# Patient Record
Sex: Female | Born: 2000 | Race: Black or African American | Hispanic: No | Marital: Single | Smoking: Never smoker
Health system: Southern US, Community
[De-identification: ages and names within clinical notes are randomized; demographics above are authoritative.]

## PROBLEM LIST (undated history)

## (undated) DIAGNOSIS — H18609 Keratoconus, unspecified, unspecified eye: Secondary | ICD-10-CM

## (undated) DIAGNOSIS — J45909 Unspecified asthma, uncomplicated: Secondary | ICD-10-CM

## (undated) HISTORY — DX: Keratoconus, unspecified, unspecified eye: H18.609

## (undated) HISTORY — PX: EYE SURGERY: SHX253

---

## 2022-03-29 ENCOUNTER — Emergency Department
Admission: EM | Admit: 2022-03-29 | Discharge: 2022-03-29 | Disposition: A | Discharge status: Home / Self Care | Payer: 59 | Attending: Emergency Medicine | Admitting: Emergency Medicine

## 2022-03-29 ENCOUNTER — Other Ambulatory Visit: Payer: Self-pay

## 2022-03-29 ENCOUNTER — Emergency Department: Payer: 59

## 2022-03-29 DIAGNOSIS — Z2914 Encounter for prophylactic rabies immune globin: Secondary | ICD-10-CM | POA: Diagnosis not present

## 2022-03-29 DIAGNOSIS — Z203 Contact with and (suspected) exposure to rabies: Secondary | ICD-10-CM | POA: Insufficient documentation

## 2022-03-29 DIAGNOSIS — Z23 Encounter for immunization: Secondary | ICD-10-CM | POA: Diagnosis not present

## 2022-03-29 DIAGNOSIS — W5321XA Bitten by squirrel, initial encounter: Secondary | ICD-10-CM | POA: Diagnosis not present

## 2022-03-29 DIAGNOSIS — Y92214 College as the place of occurrence of the external cause: Secondary | ICD-10-CM | POA: Diagnosis not present

## 2022-03-29 DIAGNOSIS — S61231A Puncture wound without foreign body of left index finger without damage to nail, initial encounter: Secondary | ICD-10-CM | POA: Diagnosis not present

## 2022-03-29 DIAGNOSIS — S6992XA Unspecified injury of left wrist, hand and finger(s), initial encounter: Secondary | ICD-10-CM | POA: Diagnosis present

## 2022-03-29 HISTORY — DX: Unspecified asthma, uncomplicated: J45.909

## 2022-03-29 MED ORDER — RABIES IMMUNE GLOBULIN 150 UNIT/ML IM INJ
20.0000 [IU]/kg | INJECTION | Freq: Once | INTRAMUSCULAR | Status: AC
  Administered 2022-03-29: 1650 [IU] via INTRAMUSCULAR
  Filled 2022-03-29: qty 11

## 2022-03-29 MED ORDER — DOXYCYCLINE MONOHYDRATE 100 MG PO TABS: 100.0000 mg | ORAL_TABLET | Freq: Two times a day (BID) | ORAL | 0 refills | Status: AC

## 2022-03-29 MED ORDER — RABIES VACCINE, PCEC IM SUSR
1.0000 mL | Freq: Once | INTRAMUSCULAR | Status: AC
  Administered 2022-03-29: 1 mL via INTRAMUSCULAR
  Filled 2022-03-29: qty 1

## 2022-03-29 NOTE — Discharge Instructions (Addendum)
Take doxycycline twice daily for the next 14 days. ?Follow up May 4th, 8th and 15th and either [place] urgent care or [place] urgent care to the [facility] system for subsequent rabies vaccines. ?

## 2022-03-29 NOTE — ED Provider Notes (Signed)
? ?[facility] ?Provider Note ? ?Patient Contact: 7:23 PM (approximate) ? ? ?History  ? ?Animal Bite ? ? ?HPI ? ?Dawn Simpson is a 21 y.o. female presents to the emergency department after patient was bitten by a squirrel along the left index finger.  Patient is an Landscape architect.  No numbness or tingling in the left hand.  No similar injuries in the past.  Animal is not available for quarantine. ? ?  ? ? ?Physical Exam  ? ?Triage Vital Signs: ?ED Triage Vitals  ?Enc Vitals Group  ?   BP 03/29/22 1802 126/79  ?   Pulse Rate 03/29/22 1802 65  ?   Resp 03/29/22 1802 16  ?   Temp 03/29/22 1802 98.4 ?F (36.9 ?C)  ?   Temp Source 03/29/22 1802 Oral  ?   SpO2 03/29/22 1802 99 %  ?   Weight 03/29/22 1803 185 lb (83.9 kg)  ?   Height 03/29/22 1803 5\' 11"  (1.803 m)  ?   Head Circumference --   ?   Peak Flow --   ?   Pain Score 03/29/22 1803 2  ?   Pain Loc --   ?   Pain Edu? --   ?   Excl. in GC? --   ? ? ?Most recent vital signs: ?Vitals:  ? 03/29/22 1802  ?BP: 126/79  ?Pulse: 65  ?Resp: 16  ?Temp: 98.4 ?F (36.9 ?C)  ?SpO2: 99%  ? ? ? ?General: Alert and in no acute distress. ?Eyes:  PERRL. EOMI. ?Head: No acute traumatic findings ?ENT: ?     Ears:  ?     Nose: No congestion/rhinnorhea. ?     Mouth/Throat: Mucous membranes are moist. ?Neck: No stridor. No cervical spine tenderness to palpation. ?Cardiovascular:  Good peripheral perfusion ?Respiratory: Normal respiratory effort without tachypnea or retractions. Lungs CTAB. Good air entry to the bases with no decreased or absent breath sounds. ?Gastrointestinal: Bowel sounds ?4 quadrants. Soft and nontender to palpation. No guarding or rigidity. No palpable masses. No distention. No CVA tenderness. ?Musculoskeletal: Full range of motion to all extremities.  Patient has small puncture wound along the volar aspect of the left index finger. ?Neurologic:  No gross focal neurologic deficits are appreciated.  ?Other: ? ? ?ED Results / Procedures / Treatments   ? ?Labs ?(all labs ordered are listed, but only abnormal results are displayed) ?Labs Reviewed - No data to display ? ? ? ? ? ?RADIOLOGY ? ?I personally viewed and evaluated these images as part of my medical decision making, as well as reviewing the written report by the radiologist. ? ?ED Provider Interpretation:  ? ? ?PROCEDURES: ? ?Critical Care performed: No ? ?Procedures ? ? ?MEDICATIONS ORDERED IN ED: ?Medications  ?rabies vaccine (RABAVERT) injection 1 mL (1 mL Intramuscular Given 03/29/22 2008)  ?rabies immune globulin (HYPERAB/KEDRAB) injection 1,650 Units (1,650 Units Intramuscular Given 03/29/22 2007)  ? ? ? ?IMPRESSION / MDM / ASSESSMENT AND PLAN / ED COURSE  ?I reviewed the triage vital signs and the nursing notes. ?             ?               ? ?Assessment and plan ?Animal bite ?21 year old female presents to the emergency department after being bitten by a squirrel. ? ?Vital signs are reassuring at triage.  On physical exam, patient was alert, active and nontoxic-appearing.  Explained to patient that risk of contracting rabies from  a squirrel is relatively low but patient elected to start rabies vaccine series empirically. ? ?X-ray of the left index finger was obtained which showed no evidence of acute bony abnormality or retained teeth. ? ?Patient was started on doxycycline given amoxicillin allergy.  She adamantly denies possibility of pregnancy.  Her tetanus status is up-to-date.  Rabies vaccine series was initiated in the emergency department and she was advised to follow-up in 3, 7 and 14 days at [facility] or [place] urgent care for subsequent rabies vaccines. ?  ? ? ?FINAL CLINICAL IMPRESSION(S) / ED DIAGNOSES  ? ?Final diagnoses:  ?Wound due to squirrel bite  ? ? ? ?Rx / DC Orders  ? ?ED Discharge Orders   ? ?      Ordered  ?  doxycycline (ADOXA) 100 MG tablet  2 times daily       ? 03/29/22 2057  ? ?  ?  ? ?  ? ? ? ?Note:  This document was prepared using Dragon voice recognition software and  may include unintentional dictation errors. ?  ?Orvil Feil, PA-C ?03/29/22 2101 ? ?  ?Gilles Chiquito, MD ?03/29/22 2303 ? ?

## 2022-03-29 NOTE — ED Notes (Signed)
Pt signed esignature  d/c inst to pt.   

## 2022-03-29 NOTE — ED Triage Notes (Signed)
Pt states she was on campus and a squirrel was crawling on a friend and when they went to get it off it bit her on the left index finger ?

## 2022-03-29 NOTE — ED Notes (Signed)
Pt's left index finger wrapped and bleeding controlled. Pt was bit by a squirrel on campus. Pt in NAD and ambulatory to room without difficulty.  ?

## 2022-04-01 ENCOUNTER — Ambulatory Visit
Admission: EM | Admit: 2022-04-01 | Discharge: 2022-04-01 | Disposition: A | Discharge status: Home / Self Care | Payer: PRIVATE HEALTH INSURANCE | Attending: Family Medicine | Admitting: Family Medicine

## 2022-04-01 DIAGNOSIS — Z203 Contact with and (suspected) exposure to rabies: Secondary | ICD-10-CM

## 2022-04-01 MED ORDER — RABIES VACCINE, PCEC IM SUSR
1.0000 mL | Freq: Once | INTRAMUSCULAR | Status: AC
  Administered 2022-04-01: 1 mL via INTRAMUSCULAR

## 2022-04-01 NOTE — ED Triage Notes (Signed)
Pt presents for 1 of 3 rabies vaccine ?

## 2022-04-05 ENCOUNTER — Ambulatory Visit
Admission: EM | Admit: 2022-04-05 | Discharge: 2022-04-05 | Disposition: A | Discharge status: Home / Self Care | Payer: PRIVATE HEALTH INSURANCE | Attending: Family Medicine | Admitting: Family Medicine

## 2022-04-05 DIAGNOSIS — Z203 Contact with and (suspected) exposure to rabies: Secondary | ICD-10-CM

## 2022-04-05 MED ORDER — RABIES VACCINE, PCEC IM SUSR
1.0000 mL | Freq: Once | INTRAMUSCULAR | Status: AC
  Administered 2022-04-05: 1 mL via INTRAMUSCULAR

## 2022-04-05 NOTE — ED Triage Notes (Signed)
Pt presents for 2 of 3 rabies vaccine.  ?

## 2022-04-12 ENCOUNTER — Ambulatory Visit
Admission: EM | Admit: 2022-04-12 | Discharge: 2022-04-12 | Disposition: A | Discharge status: Home / Self Care | Payer: 59 | Attending: Emergency Medicine | Admitting: Emergency Medicine

## 2022-04-12 DIAGNOSIS — Z203 Contact with and (suspected) exposure to rabies: Secondary | ICD-10-CM | POA: Diagnosis not present

## 2022-04-12 MED ORDER — RABIES VACCINE, PCEC IM SUSR
1.0000 mL | Freq: Once | INTRAMUSCULAR | Status: AC
  Administered 2022-04-12: 1 mL via INTRAMUSCULAR

## 2022-04-12 NOTE — ED Triage Notes (Signed)
Patient presents for 3rd rabies injection.  ? ?Patient has no complaints.  ?

## 2022-10-02 ENCOUNTER — Ambulatory Visit (INDEPENDENT_AMBULATORY_CARE_PROVIDER_SITE_OTHER): Payer: 59 | Admitting: Physician Assistant

## 2022-10-02 ENCOUNTER — Encounter: Payer: Self-pay | Admitting: Physician Assistant

## 2022-10-02 ENCOUNTER — Other Ambulatory Visit: Payer: Self-pay

## 2022-10-02 VITALS — BP 120/73 | HR 95 | Temp 97.6°F | Ht 70.08 in | Wt 189.6 lb

## 2022-10-02 DIAGNOSIS — J209 Acute bronchitis, unspecified: Secondary | ICD-10-CM | POA: Diagnosis not present

## 2022-10-02 DIAGNOSIS — J45901 Unspecified asthma with (acute) exacerbation: Secondary | ICD-10-CM

## 2022-10-02 LAB — POC COVID19 BINAXNOW: SARS Coronavirus 2 Ag: NEGATIVE

## 2022-10-02 MED ORDER — PREDNISONE 20 MG PO TABS: ORAL_TABLET | ORAL | 0 refills | Status: AC

## 2022-10-02 MED ORDER — DOXYCYCLINE HYCLATE 100 MG PO TABS: 100.0000 mg | ORAL_TABLET | Freq: Two times a day (BID) | ORAL | 0 refills | Status: AC

## 2022-10-02 NOTE — Progress Notes (Signed)
[facility] [address] [place], [place] [address] Phone: [phone] Fax: [phone]   Office Visit Note  Patient Name: Dawn Simpson Date of Birth: [id number]  Med Record Number: [id number]  Date of Service: 10/02/2022   Chief Complaint  Patient presents with   sick     Isabeau Mccalla is a 21 y.o. female with a PMHx of asthma, who presents to [facility] with complaints of cold symptoms since 1-2 weeks ago, which started getting worse at the end of last week. Having a lot of congestion, nasal pressure, and ear popping. Also cough which seems worse. Struggling with her breathing now. +HA, +chills, +fatigue. +Wheezing.  Has tried dayquil, nyquil, tylenol, which help some. Using her albuterol inhaler, which helps some.  No known aggravating factors.  Took a COVID test 10 days ago which was negative.  +Multiple sick contacts. No known COVID/flu exposures, fully vaccinated and boosted against COVID. Hasn't had flu vaccine this season.  From [place], +hx of seasonal allergies. +Hx of mono in 6th grade. Denies fevers, ear pain/drainage, ST, drooling/trismus, loss of taste/smell, CP, abd pain, N/V/D, myalgias, arthralgias, or any other complaints at this time.      Allergies: Peanut-containing drug products and Penicillins  Current Medications:  Outpatient Encounter Medications as of 10/02/2022  Medication Sig   albuterol (VENTOLIN HFA) 108 (90 Base) MCG/ACT inhaler SMARTSIG:2 Puff(s) By Mouth Every 4 Hours PRN   doxycycline (VIBRA-TABS) 100 MG tablet Take 1 tablet (100 mg total) by mouth 2 (two) times daily.   predniSONE (DELTASONE) 20 MG tablet 3 tabs po daily x 4 days   PULMICORT FLEXHALER 90 MCG/ACT inhaler Inhale 2 puffs into the lungs 2 (two) times daily.   No facility-administered encounter medications on file as of 10/02/2022.     Medical History: Past Medical History:  Diagnosis Date   Asthma    Keratoconus    Past Surgical History:  Procedure Laterality Date   EYE  SURGERY      History reviewed. No pertinent family history.  Social History   Tobacco Use  Smoking Status Never  Smokeless Tobacco Never    Social History   Substance and Sexual Activity  Alcohol Use Not Currently      ROS Review of Systems  Constitutional:  Positive for chills and fatigue. Negative for fever.  HENT:  Positive for congestion and rhinorrhea. Negative for drooling, ear discharge, ear pain, sore throat and trouble swallowing.   Respiratory:  Positive for cough, chest tightness, shortness of breath and wheezing.   Cardiovascular:  Negative for chest pain.  Gastrointestinal:  Negative for abdominal pain, diarrhea, nausea and vomiting.  Musculoskeletal:  Negative for arthralgias and myalgias.  Skin:  Negative for color change.  Allergic/Immunologic: Negative for immunocompromised state.  Neurological:  Positive for headaches.   All other pertinent ROS reviewed and noted per HPI.   Vital Signs: BP 120/73   Pulse 95   Temp 97.6 F (36.4 C) (Tympanic)   Ht 5' 10.08" (1.78 m)   Wt 189 lb 9.5 oz (86 kg)   SpO2 96%   BMI 27.14 kg/m   Physical Exam: Physical Exam Vitals and nursing note reviewed.  Constitutional:      General: She is not in acute distress.    Appearance: Normal appearance. She is well-developed. She is not toxic-appearing.     Comments: Afebrile, nontoxic, NAD  HENT:     Head: Normocephalic and atraumatic.     Right Ear: Tympanic membrane and ear canal  normal.     Left Ear: Tympanic membrane and ear canal normal.     Nose: Congestion and rhinorrhea present.     Mouth/Throat:     Mouth: Mucous membranes are moist.     Pharynx: Oropharynx is clear. Uvula midline. No pharyngeal swelling, oropharyngeal exudate, posterior oropharyngeal erythema or uvula swelling.     Tonsils: No tonsillar exudate or tonsillar abscesses. 0 on the right. 0 on the left.  Eyes:     General:        Right eye: No discharge.        Left eye: No discharge.      Conjunctiva/sclera: Conjunctivae normal.  Cardiovascular:     Rate and Rhythm: Normal rate and regular rhythm.     Pulses: Normal pulses.     Heart sounds: Normal heart sounds, S1 normal and S2 normal. No murmur heard.    No friction rub. No gallop.  Pulmonary:     Effort: Pulmonary effort is normal. No respiratory distress.     Breath sounds: Rhonchi present. No decreased breath sounds, wheezing or rales.     Comments: Mild scattered rhonchi, otherwise CTAB in all other fields, no wheeze/rales, no hypoxia or increased WOB, speaking in full sentences, SpO2 96% on RA  Abdominal:     General: Bowel sounds are normal. There is no distension.     Palpations: Abdomen is soft. Abdomen is not rigid.     Tenderness: There is no abdominal tenderness. There is no right CVA tenderness, left CVA tenderness, guarding or rebound. Negative signs include McBurney's sign.  Musculoskeletal:        General: Normal range of motion.     Cervical back: Normal range of motion and neck supple.  Lymphadenopathy:     Cervical: Cervical adenopathy (shotty, nonTTP) present.  Skin:    General: Skin is warm and dry.     Findings: No rash.  Neurological:     Mental Status: She is alert and oriented to person, place, and time.     Sensory: Sensation is intact. No sensory deficit.     Motor: Motor function is intact.  Psychiatric:        Mood and Affect: Mood and affect normal.        Behavior: Behavior normal.       Pertinent Labs/Imaging:  Results for orders placed or performed in visit on 10/02/22  POC COVID-19  Result Value Ref Range   SARS Coronavirus 2 Ag Negative Negative   No results found.   Assessment/Plan:  1. Acute bronchitis with asthma with acute exacerbation - POC COVID-19     -Pt here with 2wks of worsening URI/cough symptoms, exam reveals pt is afebrile with a rhonchi but otherwise no wheezing, no hypoxia or increased WOB, mild nasal congestion and drainage, throat benign.  -Rapid  COVID Ag negative.  -Doubt need for other testing at this time. -Given timeframe and progression, will empirically tx for bacterial etiologies -Rx doxycycline given. Also given rx for Prednisone.  -Declined neb treatment here, has neb machine at home; advised waiting until Monday to see how she feels, if she thinks she needs the machine overnighted then have her parents send it, and we can provide rx's for the neb solutions-- she knows to message for this.  -Discussed OTCs for symptomatic relief and supportive care (i.e. Alternating Tylenol 1000mg  max and Ibuprofen 600-800mg  max, Chloraseptic spray/throat lozenges for sore throat, hot tea with honey/lemon for sore throat, Flonase 2 sprays each nostril  twice daily and Netipot for congestion, antihistamines like Zyrtec/Claritin/Xyzal/etc, decongestants like Sudafed for congestion with caution, Mucinex D for cough/congestion or Mucinex DM for cough/expectoration, adequate hydration and rest, etc).  -F/up as needed or sooner if not improving.  -Strict return/ED guidelines discussed.    Meds ordered this encounter  Medications   predniSONE (DELTASONE) 20 MG tablet    Sig: 3 tabs po daily x 4 days    Dispense:  12 tablet    Refill:  0    Order Specific Question:   Supervising Provider    Answer:   Dutch Gray [[id number]]   doxycycline (VIBRA-TABS) 100 MG tablet    Sig: Take 1 tablet (100 mg total) by mouth 2 (two) times daily.    Dispense:  14 tablet    Refill:  0    Order Specific Question:   Supervising Provider    Answer:   Dutch Gray [[id number]]      General Counseling: Jesusita Oka understanding of the findings of todays visit and agrees with plan of treatment. I have discussed any further diagnostic evaluation that may be needed or ordered today. We also reviewed her medications today. she has been encouraged to call the office with any questions or concerns that should arise related to today's visit.   Total Time spent: [place], [place]

## 2022-10-02 NOTE — Patient Instructions (Signed)
Continue to stay well-hydrated. Take antibiotic until finished. Start prednisone (steroid) today, then take with breakfast all the other days. Use home inhaler as directed, as needed for cough/chest congestion/wheezing/shortness of breath. Gargle warm salt water and spit it out and use chloraseptic spray as needed for sore throat. Drink tea with honey to help with sore throat. Continue to alternate between Tylenol and Ibuprofen for pain or fever. Use Mucinex DM/Robitussin DM/etc for cough suppression/expectoration of mucus. Use over the counter flonase  (2 sprays each nostril twice daily) and the netipot to help with nasal congestion. May consider over-the-counter Benadryl or other antihistamine like Claritin/Zyrtec/Xyzal/etc to decrease secretions and for help with your symptoms.  Follow up here in the clinic if symptoms aren't improving in 5-7 days or if your symptoms suddenly start worsening. Go to the emergency department for emergent changing or worsening of symptoms.

## 2022-10-06 ENCOUNTER — Ambulatory Visit (INDEPENDENT_AMBULATORY_CARE_PROVIDER_SITE_OTHER): Payer: 59 | Admitting: Family Medicine

## 2022-10-06 ENCOUNTER — Other Ambulatory Visit: Payer: Self-pay

## 2022-10-06 ENCOUNTER — Encounter: Payer: Self-pay | Admitting: Family Medicine

## 2022-10-06 VITALS — BP 118/76 | HR 67 | Temp 98.9°F | Resp 18 | Ht 70.0 in | Wt 191.0 lb

## 2022-10-06 DIAGNOSIS — J209 Acute bronchitis, unspecified: Secondary | ICD-10-CM

## 2022-10-06 DIAGNOSIS — J45901 Unspecified asthma with (acute) exacerbation: Secondary | ICD-10-CM | POA: Diagnosis not present

## 2022-10-06 NOTE — Progress Notes (Signed)
[facility] [address] [place], [place] [address] Phone: [phone] Fax: [phone]   Office Visit Note  Patient Name: Dawn Simpson  Date of Birth:10-14-2001  Med Rec number [id number]  Date of Service: 10/06/2022  Peanut-containing drug products and Penicillins  Chief Complaint  Patient presents with   Cough     See visit from the weekend Last week had vertigo, coughed up blood, was febrile Chest congestion has improved, taking her steroids and antibiotics Nebuliser arrived yesterday Had temp of 101 yesterday  Has not yet had her flu shot  Is not getting much help from her albuterol inhaler - helps for about 40 minutes only Is aware that when her asthma flares she almost always needs oral steroids  Currently feels exhausted    Cough Associated symptoms include a fever. Her past medical history is significant for environmental allergies.   Current Medication:  Outpatient Encounter Medications as of 10/06/2022  Medication Sig   albuterol (VENTOLIN HFA) 108 (90 Base) MCG/ACT inhaler SMARTSIG:2 Puff(s) By Mouth Every 4 Hours PRN   doxycycline (VIBRA-TABS) 100 MG tablet Take 1 tablet (100 mg total) by mouth 2 (two) times daily.   predniSONE (DELTASONE) 20 MG tablet 3 tabs po daily x 4 days   PULMICORT FLEXHALER 90 MCG/ACT inhaler Inhale 2 puffs into the lungs 2 (two) times daily.   No facility-administered encounter medications on file as of 10/06/2022.      Medical History: Past Medical History:  Diagnosis Date   Asthma    Keratoconus      Vital Signs: BP 118/76   Pulse 67   Temp 98.9 F (37.2 C) (Tympanic)   Resp 18   Ht 5\' 10"  (1.778 m)   Wt 191 lb (86.6 kg)   SpO2 99%   BMI 27.41 kg/m  Peak flow reading is 350, about 60 % of predicted.    Review of Systems  Constitutional:  Positive for fatigue and fever.  Respiratory:  Positive for cough.   Allergic/Immunologic: Positive for environmental allergies.    Physical Exam Vitals  reviewed.  Constitutional:      General: She is not in acute distress.    Appearance: Normal appearance. She is not ill-appearing.  HENT:     Right Ear: Tympanic membrane normal.     Left Ear: Tympanic membrane normal.     Nose:     Right Turbinates: Swollen and pale.     Left Turbinates: Swollen and pale.     Right Sinus: No maxillary sinus tenderness or frontal sinus tenderness.     Left Sinus: No maxillary sinus tenderness or frontal sinus tenderness.     Mouth/Throat:     Mouth: Mucous membranes are moist.     Pharynx: Oropharynx is clear. No pharyngeal swelling, oropharyngeal exudate or posterior oropharyngeal erythema.  Pulmonary:     Effort: Pulmonary effort is normal. Prolonged expiration present.     Breath sounds: Decreased air movement present. Examination of the right-lower field reveals wheezing. Wheezing present.  Neurological:     Mental Status: She is alert.     Assessment/Plan:  1. Acute bronchitis with asthma with acute exacerbation Improving compared with the weekend Advise using her albuterol nebulizer every 4 hours to open her lungs better so that when she does use the inhaler it is more effective Continue steroids and antibiotics Follow up with me Friday morning at 9am for review before the weekend      General Counseling: Dawn Simpson verbalizes understanding of the  findings of todays visit and agrees with plan of treatment. I have discussed any further diagnostic evaluation that may be needed or ordered today. We also reviewed her medications today. she has been encouraged to call the office with any questions or concerns that should arise related to todays visit.   No orders of the defined types were placed in this encounter.   No orders of the defined types were placed in this encounter.     Dr Durwin Reges Jacklyn Branan ABFM University Physician

## 2022-10-08 ENCOUNTER — Ambulatory Visit: Payer: 59 | Admitting: Family Medicine

## 2022-10-11 ENCOUNTER — Encounter: Payer: Self-pay | Admitting: Family Medicine

## 2022-10-11 ENCOUNTER — Ambulatory Visit (INDEPENDENT_AMBULATORY_CARE_PROVIDER_SITE_OTHER): Payer: 59 | Admitting: Family Medicine

## 2022-10-11 VITALS — HR 71 | Temp 97.4°F | Resp 18 | Ht 70.0 in | Wt 191.0 lb

## 2022-10-11 DIAGNOSIS — J45901 Unspecified asthma with (acute) exacerbation: Secondary | ICD-10-CM | POA: Diagnosis not present

## 2022-10-11 DIAGNOSIS — J209 Acute bronchitis, unspecified: Secondary | ICD-10-CM | POA: Diagnosis not present

## 2022-10-11 DIAGNOSIS — R0981 Nasal congestion: Secondary | ICD-10-CM

## 2022-10-11 NOTE — Progress Notes (Signed)
[facility] [address] [place], [place] [address] Phone: [phone] Fax: [phone]   Office Visit Note  Patient Name: Dawn Simpson  Date of Birth:09-Aug-2001  Med Rec number [id number]  Date of Service: 10/11/2022  Peanut-containing drug products and Penicillins  Chief Complaint  Patient presents with   Follow-up     See prior OV notes  Here for follow-up Feels much better - has cut back on her nebuliser use - used this morning only Has not been using her inhaler  Main problems now is nasal congestion and ear popping    Current Medication:  Outpatient Encounter Medications as of 10/11/2022  Medication Sig   albuterol (VENTOLIN HFA) 108 (90 Base) MCG/ACT inhaler SMARTSIG:2 Puff(s) By Mouth Every 4 Hours PRN   doxycycline (VIBRA-TABS) 100 MG tablet Take 1 tablet (100 mg total) by mouth 2 (two) times daily.   predniSONE (DELTASONE) 20 MG tablet 3 tabs po daily x 4 days   PULMICORT FLEXHALER 90 MCG/ACT inhaler Inhale 2 puffs into the lungs 2 (two) times daily.   No facility-administered encounter medications on file as of 10/11/2022.      Medical History: Past Medical History:  Diagnosis Date   Asthma    Keratoconus      Vital Signs: Pulse 71   Temp (!) 97.4 F (36.3 C) (Tympanic)   Resp 18   Ht 5\' 10"  (1.778 m)   Wt 191 lb (86.6 kg)   SpO2 99%   BMI 27.41 kg/m    Review of Systems  Constitutional: Negative.  Negative for fatigue.  HENT:  Positive for congestion.   Respiratory:  Negative for shortness of breath and wheezing.   Psychiatric/Behavioral: Negative.      Physical Exam Vitals reviewed.  Constitutional:      Appearance: Normal appearance.     Comments: Looks well  HENT:     Right Ear: A middle ear effusion is present. Tympanic membrane is not injected.     Left Ear: A middle ear effusion is present. Tympanic membrane is not injected.     Nose:     Right Turbinates: Swollen.     Left Turbinates: Swollen.     Right  Sinus: No maxillary sinus tenderness or frontal sinus tenderness.     Left Sinus: No maxillary sinus tenderness or frontal sinus tenderness.     Mouth/Throat:     Mouth: Mucous membranes are moist.     Pharynx: Uvula midline. No pharyngeal swelling, oropharyngeal exudate or posterior oropharyngeal erythema.  Pulmonary:     Effort: Pulmonary effort is normal.     Breath sounds: Normal breath sounds and air entry.  Lymphadenopathy:     Cervical: No cervical adenopathy.  Neurological:     Mental Status: She is alert.       Assessment/Plan:  1. Acute bronchitis with asthma with acute exacerbation Much improved - advised to use inhaler 3 times daily for the next two weeks  2. Nasal congestion Use fluticasone nasal spray, two sprays in each nostril two times daily       General Counseling: Ellary verbalizes understanding of the findings of todays visit and agrees with plan of treatment. I have discussed any further diagnostic evaluation that may be needed or ordered today. We also reviewed her medications today. she has been encouraged to call the office with any questions or concerns that should arise related to todays visit.   No orders of the defined types were placed in this encounter.  No orders of the defined types were placed in this encounter.    Dr Evette Doffing Lean Jaeger ABFM University Physician

## 2023-11-10 IMAGING — DX DG FINGER INDEX 2+V*L*
3 series · 3 of 3 positions shown · non-contrast
Comparison: None.

CLINICAL DATA: Animal bite

EXAM:
LEFT INDEX FINGER 2+V

[finger ap]
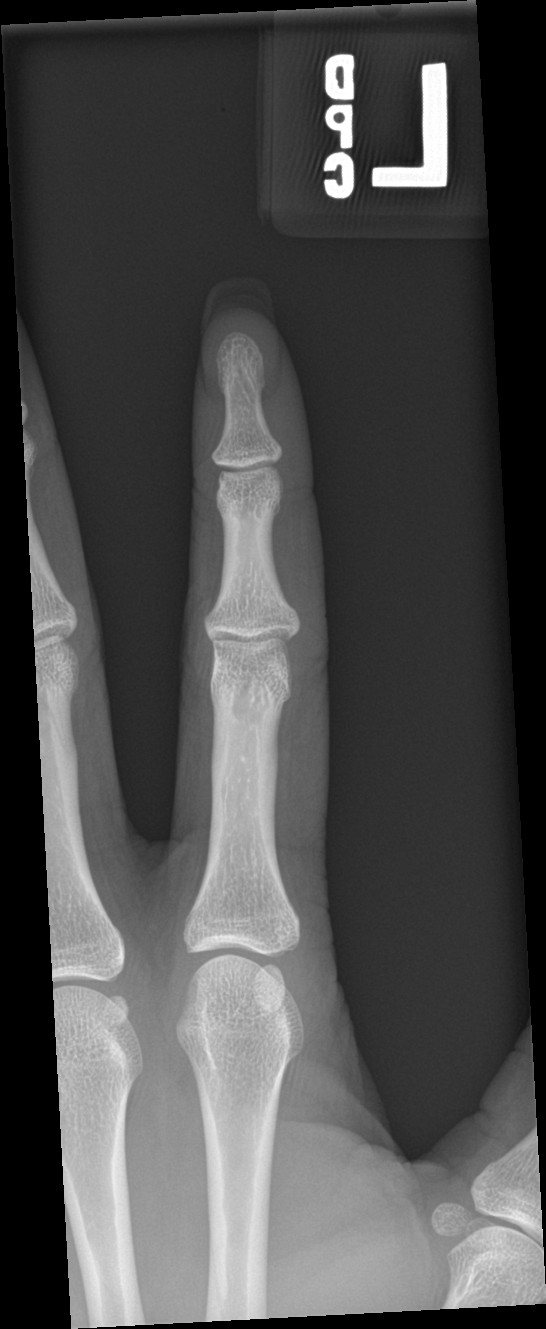

[finger obl]
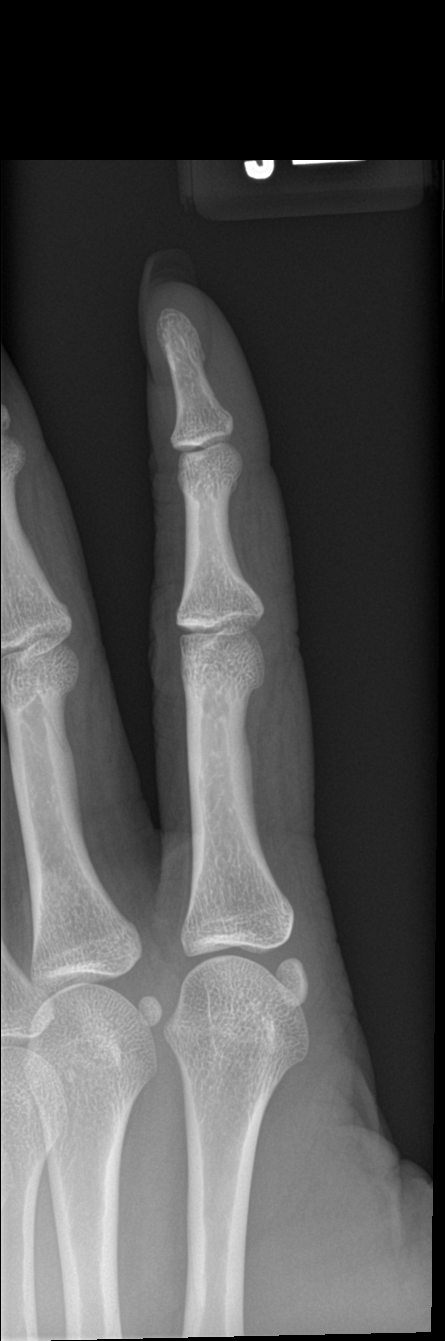

[finger lat]
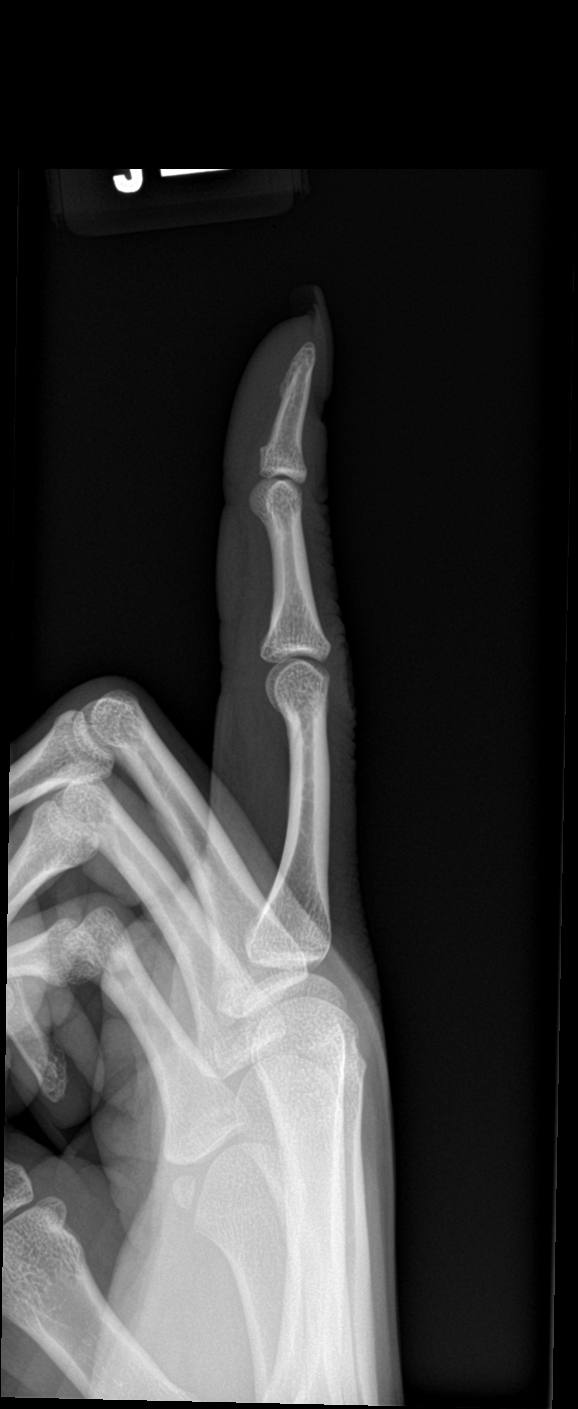

[3 of 3 positions shown; findings below may reference images not displayed]

FINDINGS: There is no evidence of fracture or dislocation. There is no
evidence of arthropathy or other focal bone abnormality. Soft
tissues are unremarkable.
IMPRESSION: Negative.
